# Patient Record
Sex: Female | Born: 2000 | Hispanic: Yes | Marital: Single | Smoking: Never smoker
Health system: Southern US, Community
[De-identification: ages and names within clinical notes are randomized; demographics above are authoritative.]

---

## 2020-04-17 ENCOUNTER — Emergency Department (HOSPITAL_COMMUNITY)
Admission: EM | Admit: 2020-04-17 | Discharge: 2020-04-18 | Disposition: A | Discharge status: Home / Self Care | Payer: Medicaid Other | Attending: Emergency Medicine | Admitting: Emergency Medicine

## 2020-04-17 ENCOUNTER — Encounter (HOSPITAL_COMMUNITY): Payer: Self-pay | Admitting: Emergency Medicine

## 2020-04-17 ENCOUNTER — Other Ambulatory Visit: Payer: Self-pay

## 2020-04-17 ENCOUNTER — Emergency Department (HOSPITAL_COMMUNITY): Payer: Medicaid Other

## 2020-04-17 DIAGNOSIS — N12 Tubulo-interstitial nephritis, not specified as acute or chronic: Secondary | ICD-10-CM | POA: Insufficient documentation

## 2020-04-17 DIAGNOSIS — Z20822 Contact with and (suspected) exposure to covid-19: Secondary | ICD-10-CM | POA: Diagnosis not present

## 2020-04-17 DIAGNOSIS — R000 Tachycardia, unspecified: Secondary | ICD-10-CM | POA: Insufficient documentation

## 2020-04-17 DIAGNOSIS — R109 Unspecified abdominal pain: Secondary | ICD-10-CM | POA: Diagnosis present

## 2020-04-17 LAB — CBC WITH DIFFERENTIAL/PLATELET
Abs Immature Granulocytes: 0.11 10*3/uL — ABNORMAL HIGH (ref 0.00–0.07)
Basophils Absolute: 0 10*3/uL (ref 0.0–0.1)
Basophils Relative: 0 %
Eosinophils Absolute: 0 10*3/uL (ref 0.0–0.5)
Eosinophils Relative: 0 %
HCT: 41.8 % (ref 36.0–46.0)
Hemoglobin: 13.8 g/dL (ref 12.0–15.0)
Immature Granulocytes: 1 %
Lymphocytes Relative: 4 %
Lymphs Abs: 0.7 10*3/uL (ref 0.7–4.0)
MCH: 29.4 pg (ref 26.0–34.0)
MCHC: 33 g/dL (ref 30.0–36.0)
MCV: 88.9 fL (ref 80.0–100.0)
Monocytes Absolute: 0.7 10*3/uL (ref 0.1–1.0)
Monocytes Relative: 4 %
Neutro Abs: 15.1 10*3/uL — ABNORMAL HIGH (ref 1.7–7.7)
Neutrophils Relative %: 91 %
Platelets: 210 10*3/uL (ref 150–400)
RBC: 4.7 MIL/uL (ref 3.87–5.11)
RDW: 14.1 % (ref 11.5–15.5)
WBC: 16.6 10*3/uL — ABNORMAL HIGH (ref 4.0–10.5)
nRBC: 0 % (ref 0.0–0.2)

## 2020-04-17 LAB — URINALYSIS, ROUTINE W REFLEX MICROSCOPIC
Bilirubin Urine: NEGATIVE
Glucose, UA: NEGATIVE mg/dL
Ketones, ur: 80 mg/dL — AB
Nitrite: NEGATIVE
Protein, ur: NEGATIVE mg/dL
Specific Gravity, Urine: 1.015 (ref 1.005–1.030)
WBC, UA: >50 WBC/hpf — ABNORMAL HIGH (ref 0–5)
pH: 6 (ref 5.0–8.0)

## 2020-04-17 LAB — COMPREHENSIVE METABOLIC PANEL
ALT: 19 U/L (ref 0–44)
AST: 24 U/L (ref 15–41)
Albumin: 4.3 g/dL (ref 3.5–5.0)
Alkaline Phosphatase: 68 U/L (ref 38–126)
Anion gap: 12 (ref 5–15)
BUN: 5 mg/dL — ABNORMAL LOW (ref 6–20)
CO2: 22 mmol/L (ref 22–32)
Calcium: 9.4 mg/dL (ref 8.9–10.3)
Chloride: 100 mmol/L (ref 98–111)
Creatinine, Ser: 0.87 mg/dL (ref 0.44–1.00)
GFR calc Af Amer: >60 mL/min (ref >60)
GFR calc non Af Amer: >60 mL/min (ref >60)
Glucose, Bld: 122 mg/dL — ABNORMAL HIGH (ref 70–99)
Potassium: 3.6 mmol/L (ref 3.5–5.1)
Sodium: 134 mmol/L — ABNORMAL LOW (ref 135–145)
Total Bilirubin: 1 mg/dL (ref 0.3–1.2)
Total Protein: 8 g/dL (ref 6.5–8.1)

## 2020-04-17 LAB — APTT: aPTT: 31 seconds (ref 24–36)

## 2020-04-17 LAB — I-STAT BETA HCG BLOOD, ED (MC, WL, AP ONLY): I-stat hCG, quantitative: <5 m[IU]/mL (ref <5)

## 2020-04-17 LAB — LACTIC ACID, PLASMA
Lactic Acid, Venous: 1.4 mmol/L (ref 0.5–1.9)
Lactic Acid, Venous: 1.7 mmol/L (ref 0.5–1.9)

## 2020-04-17 LAB — LIPASE, BLOOD: Lipase: 28 U/L (ref 11–51)

## 2020-04-17 LAB — PROTIME-INR
INR: 1.2 (ref 0.8–1.2)
Prothrombin Time: 14.5 seconds (ref 11.4–15.2)

## 2020-04-17 MED ORDER — SODIUM CHLORIDE 0.9 % IV SOLN
2.0000 g | Freq: Once | INTRAVENOUS | Status: AC
  Administered 2020-04-17: 2 g via INTRAVENOUS
  Filled 2020-04-17: qty 20

## 2020-04-17 MED ORDER — FENTANYL CITRATE (PF) 100 MCG/2ML IJ SOLN
50.0000 ug | Freq: Once | INTRAMUSCULAR | Status: AC
  Administered 2020-04-17: 50 ug via INTRAVENOUS
  Filled 2020-04-17: qty 2

## 2020-04-17 MED ORDER — IOHEXOL 300 MG/ML  SOLN
100.0000 mL | Freq: Once | INTRAMUSCULAR | Status: AC | PRN
  Administered 2020-04-17: 100 mL via INTRAVENOUS

## 2020-04-17 MED ORDER — SODIUM CHLORIDE 0.9 % IV BOLUS
1000.0000 mL | Freq: Once | INTRAVENOUS | Status: AC
  Administered 2020-04-17: 1000 mL via INTRAVENOUS

## 2020-04-17 MED ORDER — ACETAMINOPHEN 500 MG PO TABS
1000.0000 mg | ORAL_TABLET | Freq: Once | ORAL | Status: AC
  Administered 2020-04-17: 1000 mg via ORAL
  Filled 2020-04-17: qty 2

## 2020-04-17 MED ORDER — ACETAMINOPHEN 325 MG PO TABS
650.0000 mg | ORAL_TABLET | Freq: Once | ORAL | Status: AC
  Administered 2020-04-17: 650 mg via ORAL
  Filled 2020-04-17: qty 2

## 2020-04-17 NOTE — Consult Note (Addendum)
Family Medicine Teaching Service ED Consult Note Service Pager: [phone] Reason for Consult: Admission  Referring Physician: Dr. Rosalia Hammers   Primary Care Provider: System, Pcp Not In  Assessment and Plan: Judith Sosa is a 19 y.o. female presented to the ED for abdominal pain, nausea, emesis x1 times fever and diagnosed with pyelonephritis on CT scan.  On arrival, at 36 AM patient was febrile to 103.3 and tachycardic to 150. At 2000, patient provided with 650 mg of Tylenol and 1 L normal saline bolus.  At 2200, she was given IV fentanyl 2 g of ceftriaxone IV.  Patient is afebrile and vital signs otherwise stable.  On physical exam, patient is well-appearing.  Her vital signs are stable and much improved from previously. Labs significant for WBC 16.6, lactic acid wnl, UPT negative. UA with large leukocytes, many bacteria, and > 50 WBC. Urine and blood culture pending. CT with left sided pyelonephritis and no evidence of intraparenchymal abscess.  Patient reports that she feels well overall and has been tolerating p.o. today.  She would rather go home than be hospitalized.  She lives here in [place] with her boyfriend, who is at bedside.  She is amenable for follow-up in the family medicine clinic. We will follow patient's blood and urine culture and notify patient if any changes need to be made.    Pyelonephritis  Recommendations  Discharge patient home with p.o. antibiotics, Cefdinir  300 mg twice daily for 10 to 14 days and encourage continued oral hydration.  Patient reports that she uses Development worker, community on [employer]   Zofran PRN nausea   Ibuprofen PRN pain   Return precautions for inability to tolerate PO.   Patient scheduled for outpatient follow up with [facility]   Spoke to Dr. Drema Pry as Dr. Rosalia Hammers had gone home about patient discharge. He has discharged patient with DC instructions and Rx meds  Future Appointments  Date Time Provider Department Center  04/23/2020 11:25  AM ACCESS TO CARE POOL [facility]   Issues for follow up  - ensure compliance with medications - blood and urine culture   ============================================================================= HPI Judith Sosa is a 19 y.o. female no significant past medical history who presents with fever, abdominal pain, nausea, and one episode of emesis yesterday.  Patient reports that she started to have abdominal pain about 5 days ago.  She reports that that abdominal pain was located in her left belly and has acutely worsened since yesterday.  She does report having one episode of emesis accompanied by nausea not yesterday.  She does report dysuria that started a few days ago. Patient reports that she is tolerating PO fluids and was able to eat a sandwich earlier. No emesis since yesterday. Mild nausea. She denies any pelvic pain. Her LMP was about one month ago; she has regular periods.     Review Of Systems: Review of Systems  Constitutional: Positive for fever and malaise/fatigue.  HENT: Negative.   Eyes: Negative.   Respiratory: Negative.   Cardiovascular: Negative.   Gastrointestinal: Positive for nausea and vomiting. Negative for blood in stool, constipation and diarrhea.  Genitourinary: Positive for dysuria. Negative for frequency.  Musculoskeletal: Negative for myalgias.  Skin: Negative for rash.  Neurological: Negative for dizziness, weakness and headaches.  Endo/Heme/Allergies: Negative.   Psychiatric/Behavioral: Negative.    There are no problems to display for this patient.   History reviewed. No pertinent past medical history.  History reviewed. No pertinent surgical history.  family history is  not on file.   Social History   Judith Sosa reports that she has never smoked. She has never used smokeless tobacco. She reports that she does not drink alcohol and does not use drugs.  Allergies and Medications: No Known Allergies No outpatient medications have been marked as  taking for the 04/17/20 encounter [facility] Encounter).    Objective: BP 107/64   Pulse 97   Temp 99.6 F (37.6 C) (Oral)   Resp (!) 25   Ht 5\' 3"  (1.6 m)   Wt 51.3 kg   SpO2 100%   BMI 20.02 kg/m  Filed Weights   04/17/20 1115  Weight: 51.3 kg   Exam: Physical Exam Vitals and nursing note reviewed.  Constitutional:      General: She is not in acute distress.    Appearance: She is well-developed and normal weight. She is not ill-appearing, toxic-appearing or diaphoretic.  HENT:     Head: Normocephalic and atraumatic.     Mouth/Throat:     Mouth: Mucous membranes are moist.  Cardiovascular:     Rate and Rhythm: Normal rate and regular rhythm.     Pulses: Normal pulses.     Heart sounds: Normal heart sounds.  Pulmonary:     Effort: Pulmonary effort is normal.     Breath sounds: Normal breath sounds.  Abdominal:     General: Abdomen is flat. Bowel sounds are normal. There is no distension.     Palpations: Abdomen is soft.     Tenderness: There is abdominal tenderness in the left lower quadrant. There is no right CVA tenderness, left CVA tenderness, guarding or rebound.  Skin:    General: Skin is warm.     Capillary Refill: Capillary refill takes less than 2 seconds.  Neurological:     Mental Status: She is alert.  Psychiatric:        Mood and Affect: Mood normal.        Behavior: Behavior normal.      Labs and Imaging: I have personally reviewed following labs and imaging studies CBC: Recent Labs  Lab 04/17/20 1125  WBC 16.6*  NEUTROABS 15.1*  HGB 13.8  HCT 41.8  MCV 88.9  PLT 210   CMP: Recent Labs  Lab 04/17/20 1125  NA 134*  K 3.6  CL 100  CO2 22  GLUCOSE 122*  BUN 5*  CREATININE 0.87  CALCIUM 9.4  ALBUMIN 4.3     GFR: Estimated Creatinine Clearance: 83.5 mL/min (by C-G formula based on SCr of 0.87 mg/dL).  Liver Function Tests: Recent Labs  Lab 04/17/20 1125  AST 24  ALT 19  ALKPHOS 68  BILITOT 1.0  PROT 8.0  ALBUMIN 4.3    Recent Labs  Lab 04/17/20 1125  LIPASE 28    Coagulation Profile: Recent Labs  Lab 04/17/20 1125  INR 1.2    Urine analysis: Recent Labs    04/17/20 2029  COLORURINE YELLOW  APPEARANCEUR CLOUDY*  LABSPEC 1.015  PHURINE 6.0  GLUCOSEU NEGATIVE  HGBUR SMALL*  BILIRUBINUR NEGATIVE  KETONESUR 80*  PROTEINUR NEGATIVE  NITRITE NEGATIVE  LEUKOCYTESUR LARGE*    Sepsis Labs: No results for input(s): PROCALCITON in the last 72 hours.  Invalid input(s): LACTICIDVEN No results for input(s): PHVEN, PCO2VEN, HCO3 in the last 72 hours. No results for input(s): TROPIPOC in the last 72 hours.  No results found for this or any previous visit (from the past 240 hour(s)).   Imaging/Diagnostic Tests: DG Chest 2 View  Result Date: 04/17/2020  CLINICAL DATA:  Sepsis abdominal pain for 5 days with nausea and fever EXAM: CHEST - 2 VIEW COMPARISON:  None FINDINGS: Trachea midline. Cardiomediastinal contours and hilar structures are normal. Question mild central airway thickening. No lobar consolidation. No pleural effusion. Mild central airway thickening suggested. Subtle opacity at the RIGHT lung base. No acute skeletal process on limited assessment. IMPRESSION: Question mild central airway thickening and subtle opacity at the RIGHT lung base. Findings may represent early viral process. No consolidation or effusion. Electronically Signed   By: Donzetta Kohut M.D.   On: 04/17/2020 11:48   CT ABDOMEN PELVIS W CONTRAST  Result Date: 04/17/2020 CLINICAL DATA:  Right lower quadrant abdominal pain EXAM: CT ABDOMEN AND PELVIS WITH CONTRAST TECHNIQUE: Multidetector CT imaging of the abdomen and pelvis was performed using the standard protocol following bolus administration of intravenous contrast. CONTRAST:  OMNIPAQUE IOHEXOL 300 MG/ML  SOLN COMPARISON:  None. FINDINGS: Lower chest: No acute abnormality. Hepatobiliary: No focal liver abnormality is seen. No gallstones, gallbladder wall thickening,  or biliary dilatation. Pancreas: Unremarkable Spleen: Unremarkable Adrenals/Urinary Tract: The adrenal glands are unremarkable. The kidneys are normal in size and position. There is a striated nephrogram involving the lower pole of the left kidney with areas of parenchymal non enhancement in keeping with changes of pyelonephritis. No discrete intraparenchymal fluid collection identified to suggest an intraparenchymal abscess. There is no hydronephrosis. No intrarenal or ureteral calculi. The bladder is unremarkable. Stomach/Bowel: Stomach, small bowel, and large bowel are unremarkable. The appendix is not identified and may be absent. Vascular/Lymphatic: No significant vascular findings are present. No enlarged abdominal or pelvic lymph nodes. Reproductive: Uterus and bilateral adnexa are unremarkable. Other: The rectum is unremarkable. Musculoskeletal: No acute bone abnormality. IMPRESSION: Left-sided pyelonephritis. No evidence of intraparenchymal abscess. Correlation with urinalysis and urine culture may be helpful for further management. Electronically Signed   By: Helyn Numbers MD   On: 04/17/2020 22:32    EKG Interpretation  Date/Time:    Ventricular Rate:    PR Interval:    QRS Duration:   QT Interval:    QTC Calculation:   R Axis:     Text Interpretation:          Melene Plan, M.D. 04/18/2020, 12:29 AM PGY-3, [facility] FPTS Intern pager: [phone], text pages welcome

## 2020-04-17 NOTE — ED Notes (Signed)
Jesus, father, [phone] would like an update when available

## 2020-04-17 NOTE — ED Provider Notes (Signed)
[facility] EMERGENCY DEPARTMENT Provider Note   CSN: [id number] Arrival date & time: 04/17/20  1058     History Chief Complaint  Patient presents with  . Abdominal Pain  . Fever    Judith Sosa is a 19 y.o. female.  HPI   All 5 caveat history obtained through translator due to language barrier patient is Spanish-speaking 19 year old female G0 P0 sexually active presents today complaining of abdominal pain and fever.  Patient reports that she had left upper quadrant abdominal pain that began 5 days ago.  Yesterday she began having fever and had nausea with one episode of emesis.  She has been taking ibuprofen without relief.  Pain was worse today when she tried to urinate this morning.  She had not previously had any urinary tract infection symptoms denying previous pain with urination, frequency, or decreased urinary output.  She denies any diarrhea.  She has no known exposures to Covid and has previously been vaccinated for Covid.  Denies cough, chest pain, or dyspnea.  She denies any STIs or vaginal discharge. History reviewed. No pertinent past medical history.  There are no problems to display for this patient.   History reviewed. No pertinent surgical history.   OB History   No obstetric history on file.     No family history on file.  Social History   Tobacco Use  . Smoking status: Never Smoker  . Smokeless tobacco: Never Used  Substance Use Topics  . Alcohol use: Never  . Drug use: Never    Home Medications Prior to Admission medications   Not on File    Allergies    Patient has no known allergies.  Review of Systems   Review of Systems  All other systems reviewed and are negative.   Physical Exam Updated Vital Signs BP 140/86   Pulse (!) 111   Temp (!) 103 F (39.4 C) (Oral)   Resp 20   Ht 1.6 m (5\' 3" )   Wt 51.3 kg   SpO2 100%   BMI 20.02 kg/m   Physical Exam Vitals and nursing note reviewed.  Constitutional:       Appearance: She is well-developed.  HENT:     Head: Normocephalic and atraumatic.     Right Ear: External ear normal.     Left Ear: External ear normal.     Nose: Nose normal.  Eyes:     Conjunctiva/sclera: Conjunctivae normal.     Pupils: Pupils are equal, round, and reactive to light.  Cardiovascular:     Rate and Rhythm: Regular rhythm. Tachycardia present.     Heart sounds: Normal heart sounds.  Pulmonary:     Effort: Pulmonary effort is normal.     Breath sounds: Normal breath sounds.  Abdominal:     General: Bowel sounds are normal.     Palpations: Abdomen is soft.     Tenderness: There is abdominal tenderness in the right lower quadrant.  Musculoskeletal:        General: Normal range of motion.     Cervical back: Normal range of motion and neck supple.  Skin:    General: Skin is warm and dry.  Neurological:     Mental Status: She is alert and oriented to person, place, and time.     Deep Tendon Reflexes: Reflexes are normal and symmetric.  Psychiatric:        Behavior: Behavior normal.        Thought Content: Thought content normal.  Judgment: Judgment normal.     ED Results / Procedures / Treatments   Labs (all labs ordered are listed, but only abnormal results are displayed) Labs Reviewed  COMPREHENSIVE METABOLIC PANEL - Abnormal; Notable for the following components:      Result Value   Sodium 134 (*)    Glucose, Bld 122 (*)    BUN 5 (*)    All other components within normal limits  CBC WITH DIFFERENTIAL/PLATELET - Abnormal; Notable for the following components:   WBC 16.6 (*)    Neutro Abs 15.1 (*)    Abs Immature Granulocytes 0.11 (*)    All other components within normal limits  CULTURE, BLOOD (ROUTINE X 2)  CULTURE, BLOOD (ROUTINE X 2)  LACTIC ACID, PLASMA  PROTIME-INR  LIPASE, BLOOD  LACTIC ACID, PLASMA  URINALYSIS, ROUTINE W REFLEX MICROSCOPIC  I-STAT BETA HCG BLOOD, ED ([facility], [facility], [facility] ONLY)    EKG None  Radiology DG Chest 2  View  Result Date: 04/17/2020 CLINICAL DATA:  Sepsis abdominal pain for 5 days with nausea and fever EXAM: CHEST - 2 VIEW COMPARISON:  None FINDINGS: Trachea midline. Cardiomediastinal contours and hilar structures are normal. Question mild central airway thickening. No lobar consolidation. No pleural effusion. Mild central airway thickening suggested. Subtle opacity at the RIGHT lung base. No acute skeletal process on limited assessment. IMPRESSION: Question mild central airway thickening and subtle opacity at the RIGHT lung base. Findings may represent early viral process. No consolidation or effusion. Electronically Signed   By: Donzetta Kohut M.D.   On: 04/17/2020 11:48   CT ABDOMEN PELVIS W CONTRAST  Result Date: 04/17/2020 CLINICAL DATA:  Right lower quadrant abdominal pain EXAM: CT ABDOMEN AND PELVIS WITH CONTRAST TECHNIQUE: Multidetector CT imaging of the abdomen and pelvis was performed using the standard protocol following bolus administration of intravenous contrast. CONTRAST:  OMNIPAQUE IOHEXOL 300 MG/ML  SOLN COMPARISON:  None. FINDINGS: Lower chest: No acute abnormality. Hepatobiliary: No focal liver abnormality is seen. No gallstones, gallbladder wall thickening, or biliary dilatation. Pancreas: Unremarkable Spleen: Unremarkable Adrenals/Urinary Tract: The adrenal glands are unremarkable. The kidneys are normal in size and position. There is a striated nephrogram involving the lower pole of the left kidney with areas of parenchymal non enhancement in keeping with changes of pyelonephritis. No discrete intraparenchymal fluid collection identified to suggest an intraparenchymal abscess. There is no hydronephrosis. No intrarenal or ureteral calculi. The bladder is unremarkable. Stomach/Bowel: Stomach, small bowel, and large bowel are unremarkable. The appendix is not identified and may be absent. Vascular/Lymphatic: No significant vascular findings are present. No enlarged abdominal or pelvic  lymph nodes. Reproductive: Uterus and bilateral adnexa are unremarkable. Other: The rectum is unremarkable. Musculoskeletal: No acute bone abnormality. IMPRESSION: Left-sided pyelonephritis. No evidence of intraparenchymal abscess. Correlation with urinalysis and urine culture may be helpful for further management. Electronically Signed   By: Helyn Numbers MD   On: 04/17/2020 22:32    Procedures Procedures (including critical care time)  Medications Ordered in ED Medications  acetaminophen (TYLENOL) tablet 1,000 mg (1,000 mg Oral Given 04/17/20 1120)  acetaminophen (TYLENOL) tablet 650 mg (650 mg Oral Given 04/17/20 2014)    ED Course  I have reviewed the triage vital signs and the nursing notes.  Pertinent labs & imaging results that were available during my care of the patient were reviewed by me and considered in my medical decision making (see chart for details).    MDM Rules/Calculators/A&P  Patient presents today with abdominal pain, fever, leukocytosis, tachycardia. Work up c.w. pyelonephritis although significant rlq ttp IV fluids, rocephin given. Plan admission for ongoing iv fluids and antibiotics Discussed with Dr. Pecola Leisure, Family Practice who will see for admission  Final Clinical Impression(s) / ED Diagnoses Final diagnoses:  Pyelonephritis    Rx / DC Orders ED Discharge Orders    None       Margarita Grizzle, MD 04/17/20 2320

## 2020-04-17 NOTE — ED Notes (Signed)
Father-in-law, Jesus updated.

## 2020-04-17 NOTE — ED Triage Notes (Signed)
Left lower quadrant abd pain for the past 5 days with nausea and fever for the past 5 days, pt state she is Covid vaccinated and denies any covid exposure.

## 2020-04-18 LAB — SARS CORONAVIRUS 2 BY RT PCR (HOSPITAL ORDER, PERFORMED IN ~~LOC~~ HOSPITAL LAB): SARS Coronavirus 2: NEGATIVE

## 2020-04-18 MED ORDER — ONDANSETRON 4 MG PO TBDP: 4.0000 mg | ORAL_TABLET | Freq: Three times a day (TID) | ORAL | 0 refills | Status: AC | PRN

## 2020-04-18 MED ORDER — CEFDINIR 300 MG PO CAPS: 300.0000 mg | ORAL_CAPSULE | Freq: Two times a day (BID) | ORAL | 0 refills | Status: AC

## 2020-04-18 NOTE — Discharge Instructions (Signed)
You may take 600mg  motrin every 6-8 hours as need pain.

## 2020-04-19 LAB — URINE CULTURE: Culture: >=100000 — AB

## 2020-04-20 ENCOUNTER — Telehealth: Payer: Self-pay

## 2020-04-20 NOTE — Telephone Encounter (Signed)
Post ED Visit - Positive Culture Follow-up  Culture report reviewed by antimicrobial stewardship pharmacist: [facility] []  , Pharm.D. []  Enzo Bi, Pharm.D., BCPS AQ-ID []  , Pharm.D., BCPS []  Celedonio Miyamoto, .D., BCPS []  [place], .D., BCPS, AAHIVP []  Georgina Pillion, Pharm.D., BCPS, AAHIVP [x]  [address], PharmD, BCPS []  , PharmD, BCPS []  [place], PharmD, BCPS []  [address], PharmD []  , PharmD, BCPS []  Estella Husk, PharmD  Pharmacy Team []  Lysle Pearl, PharmD []  , PharmD []  Phillips Climes, PharmD []  , Rph []  Agapito Games) , PharmD []  Verlan Friends, PharmD []  , PharmD []  Mervyn Gay, PharmD []  , PharmD []  Vinnie Level, PharmD []  Wonda Olds, PharmD []  , PharmD []  Len Childs, PharmD   Positive Urine culture Treated with Cefdinir, organism sensitive to the same and no further patient follow-up is required at this time.  04/20/2020, 10:28 AM

## 2020-04-22 LAB — CULTURE, BLOOD (ROUTINE X 2)
Culture: NO GROWTH
Culture: NO GROWTH
Special Requests: ADEQUATE

## 2020-04-23 ENCOUNTER — Ambulatory Visit (INDEPENDENT_AMBULATORY_CARE_PROVIDER_SITE_OTHER): Payer: Self-pay | Admitting: Family Medicine

## 2020-04-23 ENCOUNTER — Other Ambulatory Visit: Payer: Self-pay

## 2020-04-23 VITALS — BP 98/56 | HR 67 | Ht 63.0 in | Wt 130.0 lb

## 2020-04-23 DIAGNOSIS — Z711 Person with feared health complaint in whom no diagnosis is made: Secondary | ICD-10-CM

## 2020-04-23 DIAGNOSIS — F489 Nonpsychotic mental disorder, unspecified: Secondary | ICD-10-CM

## 2020-04-23 DIAGNOSIS — N12 Tubulo-interstitial nephritis, not specified as acute or chronic: Secondary | ICD-10-CM

## 2020-04-23 LAB — POCT URINALYSIS DIP (CLINITEK)
Bilirubin, UA: NEGATIVE
Glucose, UA: NEGATIVE mg/dL
Ketones, POC UA: NEGATIVE mg/dL
Leukocytes, UA: NEGATIVE
Nitrite, UA: NEGATIVE
POC PROTEIN,UA: NEGATIVE
Spec Grav, UA: 1.02 (ref 1.010–1.025)
Urobilinogen, UA: 0.2 E.U./dL
pH, UA: 6.5 (ref 5.0–8.0)

## 2020-04-23 NOTE — Patient Instructions (Addendum)
Outpatient Mental Health Providers (No Insurance required or Self Pay)  [facility]  [address] [place], [place] [phone] Crisis [phone]  [facility]) can see uninsured folks for outpatient therapy [email or web address] [address] [place], [place] [address] [phone]  [facility]    Walk-in Mon-Fri, 8am-3pm [email or web address] [email or web address] [address], [place], [place]  [phone]   [address]  [place] [phone] [facility] for psych med management, there may be a wait- if [facility] is working with clients for OPT, they will coordinate with [facility] for psych  [facility]   Walk-in-Clinic: Monday- Friday 9:00 AM - 4:00 PM [address]   [place], [place] [phone]  [facility] the [place] ([employer]) walk in M-F 8am-12pm and  1pm-3pm [place]- [address]     [phone]  [employer] [address]  Phone: [phone]  [employer] (Mental Health and substance challenges) [address]   [place] [phone]    [email or web address]    [facility]  [place] -[address] [place], [place]     Phone:  [phone] [place]-  [address]  [phone]   [facility]  [address] [place]  [phone] [email or web address]   [facility] ( virtual or zoom therapy) [email or web address]  [address]  [phone]    [facility] [phone]  grief counseling, dementia and caregiver support    [facility] Walk-in MWF 12:30 to 3:00     [address] [place] [address]  [phone]  [email or web address] call to schedule an appointment    Mental [facility] Classes ,Support group, Peer support services, [address], [place], [place] [address] [phone]   PhotoSolver.pl           National Alliance on Mental Illness (NAMI) [place]- Wellness classes, Support groups        [address], [place], [place] [address] [phone]   [email or web address]   [facility]  (Psycho-social Rehabilitation clubhouse, Individual and group therapy) [address] [place], [place] [address]   [phone]  24- Hour Availability:  [facility] [phone] or [phone] * [facility] [employer] (Domestic Violence, Rape, etc. )[phone] Vesta Mixer [phone] or [phone] * [facility] [phone] only) [phone] (after hours) *[facility] Mobile Crisis Unit [phone] *[place] National Suicide Hotline 437-241-8759 Len Childs)   Continue your antibiotics. Tylenol for discomfort as needed.  If you have any concerns please call [phone]  Dana Allan, MD Family Medicine Residency

## 2020-04-24 ENCOUNTER — Telehealth: Payer: Self-pay | Admitting: *Deleted

## 2020-04-24 LAB — CBC WITH DIFFERENTIAL/PLATELET
Basophils Absolute: 0 10*3/uL (ref 0.0–0.2)
Basos: 1 %
EOS (ABSOLUTE): 0.1 10*3/uL (ref 0.0–0.4)
Eos: 2 %
Hematocrit: 37.2 % (ref 34.0–46.6)
Hemoglobin: 11.9 g/dL (ref 11.1–15.9)
Immature Grans (Abs): 0 10*3/uL (ref 0.0–0.1)
Immature Granulocytes: 1 %
Lymphocytes Absolute: 1.7 10*3/uL (ref 0.7–3.1)
Lymphs: 26 %
MCH: 28.6 pg (ref 26.6–33.0)
MCHC: 32 g/dL (ref 31.5–35.7)
MCV: 89 fL (ref 79–97)
Monocytes Absolute: 0.4 10*3/uL (ref 0.1–0.9)
Monocytes: 6 %
Neutrophils Absolute: 4.1 10*3/uL (ref 1.4–7.0)
Neutrophils: 64 %
Platelets: 345 10*3/uL (ref 150–450)
RBC: 4.16 x10E6/uL (ref 3.77–5.28)
RDW: 13.6 % (ref 11.7–15.4)
WBC: 6.3 10*3/uL (ref 3.4–10.8)

## 2020-04-24 NOTE — Chronic Care Management (AMB) (Signed)
   Care Management   Outreach Note  04/24/2020 Name: Judith Sosa MRN: [id number] DOB: Dec 03, 2000  Judith Sosa is a 19 y.o. year old female who is a primary care patient of Dana Allan, MD. I reached out to [name] by phone today in response to a referral sent by [name] PCP, Dana Allan, MD.     An unsuccessful telephone outreach was attempted today. The patient was referred to the case management team for assistance with care management and care coordination.   Follow Up Plan: A HIPAA compliant phone message was left for the patient providing contact information and requesting a return call. The care management team will reach out to the patient again over the next 7 days. If patient returns call to provider office, please advise to call Embedded Care Management Care Guide Gwenevere Ghazi at [phone].  Gwenevere Ghazi  Care Guide, Embedded Care Coordination [facility] Management

## 2020-04-24 NOTE — Progress Notes (Signed)
    SUBJECTIVE:   CHIEF COMPLAINT / HPI: follow up pylenonephritis  History obtained with use of Video interpreter service She is not an established patient at the clinic.  Patient was seen in the ED 09/07 for pyelonephritis.  She was consulted to FPTS who felt that hoppsital admission was not warranted at that time given improvement in the ED.  Blood cultures negative at that time.  Was given Ceftriaxone x1 and discharged on Cefdinir.  She continues to improve and reports small amount of discomfort on Left side.  Denies any fevers, dysuria or vaginal discharge.  PHQ 9 score 5 and scored some days thinking of harming self.  She has no SI/HI today and has no plan.    PERTINENT  PMH / PSH:  None  OBJECTIVE:   BP (!) 98/56   Pulse 67   Ht 5\' 3"  (1.6 m)   Wt 130 lb (59 kg)   LMP 04/21/2020   SpO2 99%   BMI 23.03 kg/m    General: Alert and oriented, no apparent distress  Cardiovascular: RRR with no murmurs noted Respiratory: CTA bilaterally  Gastrointestinal: Bowel sounds present. No abdominal pain.  Mild left CVA tnderness Psych: Behavior and speech appropriate to situation  ASSESSMENT/PLAN:   Pyelonephritis Improving.   -Conintnue Cefdinir as prescribed by FPTS -Financial packet and New patient package provided  -Strict return precautions provided -Follow up with PCP as needed or if symptoms worsen   Mental health problem PHQ9 score 5.  No SI/HI. -Cental health resources and 24 hr crisis lineprovided -CCM referral for mental health concerns -Financial and New patient packet provided -Follow up in 1 week      06/21/2020, MD [facility]

## 2020-04-28 ENCOUNTER — Encounter: Payer: Self-pay | Admitting: Family Medicine

## 2020-04-28 DIAGNOSIS — F489 Nonpsychotic mental disorder, unspecified: Secondary | ICD-10-CM | POA: Insufficient documentation

## 2020-04-28 DIAGNOSIS — N12 Tubulo-interstitial nephritis, not specified as acute or chronic: Secondary | ICD-10-CM | POA: Insufficient documentation

## 2020-04-28 NOTE — Assessment & Plan Note (Signed)
Improving.   -Conintnue Cefdinir as prescribed by FPTS -Financial packet and New patient package provided  -Strict return precautions provided -Follow up with PCP as needed or if symptoms worsen

## 2020-04-28 NOTE — Progress Notes (Deleted)
    SUBJECTIVE:   CHIEF COMPLAINT / HPI: follow up PHQ 9 positive  ***  PERTINENT  PMH / PSH: ***  OBJECTIVE:   LMP 04/21/2020   ***  ASSESSMENT/PLAN:   No problem-specific Assessment & Plan notes found for this encounter.     Dana Allan, MD [facility]

## 2020-04-28 NOTE — Assessment & Plan Note (Signed)
PHQ9 score 5.  No SI/HI. -Cental health resources and 24 hr crisis lineprovided -CCM referral for mental health concerns -Financial and New patient packet provided -Follow up in 1 week

## 2020-04-30 ENCOUNTER — Ambulatory Visit: Payer: Self-pay | Admitting: Family Medicine

## 2020-04-30 NOTE — Chronic Care Management (AMB) (Signed)
   Care Management   Outreach Note  04/30/2020 Name: Judith Sosa MRN: [id number] DOB: March 25, 2001  Judith Sosa is a 18 y.o. year old female who is a primary care patient of Dana Allan, MD. I reached out to [name] by phone today in response to a referral sent by [name] PCP, Dana Allan, MD.     A second unsuccessful telephone outreach was attempted today. The patient was referred to the case management team for assistance with care management and care coordination.   Follow Up Plan: The care management team will reach out to the patient again over the next 7 days. If patient returns call to provider office, please advise to call Embedded Care Management Care Guide Gwenevere Ghazi at [phone].  Gwenevere Ghazi  Care Guide, Embedded Care Coordination [facility] Management

## 2020-05-07 NOTE — Chronic Care Management (AMB) (Signed)
  Care Management   Outreach Note  05/07/2020 Name: Atalie Oros MRN: [id number] DOB: 04-30-2001  Kirin Brandenburger is a 19 y.o. year old female who is a primary care patient of Dana Allan, MD. I reached out to [name] by phone today in response to a referral sent by [name] PCP, Dana Allan, MD.     Third unsuccessful telephone outreach was attempted today. The patient was referred to the case management team for assistance with care management and care coordination. The patient's primary care provider has been notified of our unsuccessful attempts to make or maintain contact with the patient. The care management team is pleased to engage with this patient at any time in the future should he/she be interested in assistance from the care management team.   Follow Up Plan: We have been unable to make contact with the patient to schedule referral with LCSW. The care management team is available to follow up with the patient after provider conversation with the patient regarding recommendation for care management engagement and subsequent re-referral to the care management team.   [facility] Guide, Embedded Care Coordination [facility]  Care Management

## 2020-06-05 ENCOUNTER — Other Ambulatory Visit: Payer: Self-pay

## 2020-06-05 ENCOUNTER — Emergency Department (HOSPITAL_COMMUNITY)
Admission: EM | Admit: 2020-06-05 | Discharge: 2020-06-05 | Disposition: A | Discharge status: Home / Self Care | Payer: Medicaid Other | Attending: Emergency Medicine | Admitting: Emergency Medicine

## 2020-06-05 ENCOUNTER — Encounter (HOSPITAL_COMMUNITY): Payer: Self-pay | Admitting: Emergency Medicine

## 2020-06-05 ENCOUNTER — Emergency Department (HOSPITAL_COMMUNITY): Payer: Medicaid Other

## 2020-06-05 DIAGNOSIS — M791 Myalgia, unspecified site: Secondary | ICD-10-CM | POA: Diagnosis not present

## 2020-06-05 DIAGNOSIS — Z20822 Contact with and (suspected) exposure to covid-19: Secondary | ICD-10-CM | POA: Diagnosis not present

## 2020-06-05 DIAGNOSIS — R079 Chest pain, unspecified: Secondary | ICD-10-CM | POA: Diagnosis not present

## 2020-06-05 DIAGNOSIS — R059 Cough, unspecified: Secondary | ICD-10-CM | POA: Diagnosis present

## 2020-06-05 DIAGNOSIS — J069 Acute upper respiratory infection, unspecified: Secondary | ICD-10-CM | POA: Insufficient documentation

## 2020-06-05 LAB — BASIC METABOLIC PANEL
Anion gap: 11 (ref 5–15)
BUN: 6 mg/dL (ref 6–20)
CO2: 24 mmol/L (ref 22–32)
Calcium: 9.3 mg/dL (ref 8.9–10.3)
Chloride: 102 mmol/L (ref 98–111)
Creatinine, Ser: 0.63 mg/dL (ref 0.44–1.00)
GFR, Estimated: >60 mL/min (ref >60)
Glucose, Bld: 84 mg/dL (ref 70–99)
Potassium: 4.1 mmol/L (ref 3.5–5.1)
Sodium: 137 mmol/L (ref 135–145)

## 2020-06-05 LAB — CBC
HCT: 39.8 % (ref 36.0–46.0)
Hemoglobin: 12.8 g/dL (ref 12.0–15.0)
MCH: 29.2 pg (ref 26.0–34.0)
MCHC: 32.2 g/dL (ref 30.0–36.0)
MCV: 90.9 fL (ref 80.0–100.0)
Platelets: 245 10*3/uL (ref 150–400)
RBC: 4.38 MIL/uL (ref 3.87–5.11)
RDW: 14.6 % (ref 11.5–15.5)
WBC: 7.8 10*3/uL (ref 4.0–10.5)
nRBC: 0 % (ref 0.0–0.2)

## 2020-06-05 LAB — RESPIRATORY PANEL BY RT PCR (FLU A&B, COVID)
Influenza A by PCR: NEGATIVE
Influenza B by PCR: NEGATIVE
SARS Coronavirus 2 by RT PCR: NEGATIVE

## 2020-06-05 LAB — ACETAMINOPHEN LEVEL: Acetaminophen (Tylenol), Serum: <10 ug/mL — ABNORMAL LOW (ref 10–30)

## 2020-06-05 LAB — TROPONIN I (HIGH SENSITIVITY): Troponin I (High Sensitivity): 4 ng/L (ref <18)

## 2020-06-05 LAB — I-STAT BETA HCG BLOOD, ED (MC, WL, AP ONLY): I-stat hCG, quantitative: <5 m[IU]/mL (ref <5)

## 2020-06-05 LAB — SALICYLATE LEVEL: Salicylate Lvl: <7 mg/dL — ABNORMAL LOW (ref 7.0–30.0)

## 2020-06-05 MED ORDER — BENZONATATE 100 MG PO CAPS: 100.0000 mg | ORAL_CAPSULE | Freq: Three times a day (TID) | ORAL | 0 refills | Status: AC

## 2020-06-05 NOTE — Discharge Instructions (Addendum)
At this time there does not appear to be the presence of an emergent medical condition, however there is always the potential for conditions to change. Please read and follow the below instructions.  Please return to the Emergency Department immediately for any new or worsening symptoms or if your symptoms do not improve within 3 days. Please be sure to follow up with your Primary Care Provider within one week regarding your visit today; please call their office to schedule an appointment even if you are feeling better for a follow-up visit.  If you do not have a primary care provider please call the [facility] on your discharge paperwork to establish one. You may use the medication Tessalon as prescribed to help with your cough.  Please drink plenty water to avoid dehydration and get plenty of rest.  Go to the nearest Emergency Department immediately if: You have fever or chills You have shortness of breath that gets worse. You have very bad or constant: Headache. Ear pain. Pain in your forehead, behind your eyes, and over your cheekbones (sinus pain). Chest pain. You have long-lasting (chronic) lung disease along with any of these: Wheezing. Long-lasting cough. Coughing up blood. A change in your usual mucus. You have a stiff neck. You have changes in your: Vision. Hearing. Thinking. Mood. You have any new/concerning or worsening of symptoms   Please read the additional information packets attached to your discharge summary.  Do not take your medicine if  develop an itchy rash, swelling in your mouth or lips, or difficulty breathing; call 911 and seek immediate emergency medical attention if this occurs.  You may review your lab tests and imaging results in their entirety on your MyChart account.  Please discuss all results of fully with your primary care provider and other specialist at your follow-up visit.  Note: Portions of this text may  have been transcribed using voice recognition software. Every effort was made to ensure accuracy; however, inadvertent computerized transcription errors may still be present. ---------------------- Below has been translated using Google translate.  Errors may be present.  Interpret with caution.  A continuacin se ha traducido con Soil scientist. Puede haber errores. Interprete con precaucin. ----------------------- En este momento no parece haber la presencia de una afeccin mdica emergente, sin embargo, siempre existe la posibilidad de que las afecciones [place]. Lea y siga las instrucciones a continuacin.  1. Regrese al Departamento de Emergencias de inmediato por cualquier sntoma nuevo o que empeore o si sus sntomas no mejoran en 3 das. 2. Asegrese de hacer un seguimiento con su Proveedor de atencin primaria dentro de una semana con respecto a su visita de hoy; llame a su oficina para programar una cita, incluso si se siente mejor para una visita de seguimiento. Si no tiene un proveedor de Marine scientist, llame al [employer] y Health visitor de la comunidad de [facility] en su documentacin de alta para establecer uno. 3. Puede usar el [employer] segn lo prescrito para ayudar con la tos. Beba mucha agua para evitar la deshidratacin y descanse lo suficiente.  Vaya al Departamento de Emergencias ms cercano de inmediato si: ? Tienes fiebre o escalofros 1. Tiene dificultad para respirar que empeora. 2. Tienes muy mal o constante: o Dolor de [place]. o Dolor de odo. o Dolor en la frente, detrs de los ojos y [employer] pmulos (dolor de los senos nasales). o Dolor de [place]. 3. Tiene una enfermedad pulmonar prolongada (  crnica) junto con cualquiera de los siguientes: o Sibilancias. o Tos prolongada. o Tos con sangre. o Un cambio en su moco habitual. 4. Tiene rigidez en el cuello. 5. Tiene cambios en su: o Visin. o Audicin. o Pensar. o Estado de  nimo. 6. Tiene sntomas nuevos, preocupantes o que empeoran   Lea los paquetes de informacin adicional adjuntos a su resumen de alta.  No tome su medicamento si desarrolla un sarpullido con picazn, hinchazn en su boca o labios, o dificultad para respirar; Llame al 911 y busque atencin mdica de emergencia inmediata si esto ocurre.  Puede revisar sus pruebas de laboratorio y los resultados de las imgenes en su totalidad en su cuenta MyChart. Por favor, analice todos los resultados con su proveedor de atencin primaria y Dietitian en su visita de seguimiento.  Nota: Es posible que algunas partes de este texto se hayan transcrito con un software de reconocimiento de voz. Se hizo todo lo posible para garantizar la precisin; sin embargo, an Charity fundraiser errores de transcripcin computarizados inadvertidos.

## 2020-06-05 NOTE — ED Notes (Signed)
Pt blood drawn in triage 

## 2020-06-05 NOTE — ED Notes (Signed)
Patient verbalizes understanding of discharge instructions. Opportunity for questioning and answers were provided. Pt discharged from ED ambulatory.  

## 2020-06-05 NOTE — ED Provider Notes (Signed)
[facility] EMERGENCY DEPARTMENT Provider Note   CSN: [id number] Arrival date & time: 06/05/20  1779     History Chief Complaint  Patient presents with  . Cough  . Chest Pain   Spanish video translator used during this visit. Judith Sosa is a 19 y.o. female otherwise healthy no daily medication use.  Patient presents today for cough, chest pain, rhinorrhea, body aches onset 5 days ago.  Symptoms have been gradually worsening since onset.  She describes a nonproductive cough associated with a central chest burning pain which will occasionally radiate to her back with a strong cough, this is a moderate intensity pain improves when she is not coughing.  She describes clear rhinorrhea as well.  Generalized body aches without focal pain.  Additionally patient reports few episodes of nonbloody diarrhea over the past few days.  Patient reports that she has taken a whole packet of aspirin, patient believes this may be around 8 pills unknown dosage for her symptoms.  The patient reports that she has received 2 doses of the move during Covid vaccine earlier this year.  She denies any sick contacts.  Reports feeling warm at home has not measured a fever.  Denies headache, stiff neck, hemoptysis, abdominal pain, vomiting, extremity swelling/color change, numbness/tingling, weakness, history of blood clot, history of exogenous hormone use, history of cancer, recent surgery/immobilization or any additional concerns. HPI     History reviewed. No pertinent past medical history.  Patient Active Problem List   Diagnosis Date Noted  . Pyelonephritis 04/28/2020  . Mental health problem 04/28/2020    History reviewed. No pertinent surgical history.   OB History   No obstetric history on file.     No family history on file.  Social History   Tobacco Use  . Smoking status: Never Smoker  . Smokeless tobacco: Never Used  Substance Use Topics  . Alcohol use: Never  . Drug  use: Never    Home Medications Prior to Admission medications   Medication Sig Start Date End Date Taking? Authorizing Provider  benzonatate (TESSALON) 100 MG capsule Take 1 capsule (100 mg total) by mouth every 8 (eight) hours. 06/05/20   Bill Salinas, PA-C    Allergies    Patient has no known allergies.  Review of Systems   Review of Systems Ten systems are reviewed and are negative for acute change except as noted in the HPI  Physical Exam Updated Vital Signs BP 110/65   Pulse 77   Temp 98.7 F (37.1 C) (Oral)   Resp 16   LMP 05/18/2020   SpO2 100%   Physical Exam Constitutional:      General: She is not in acute distress.    Appearance: Normal appearance. She is well-developed. She is not ill-appearing or diaphoretic.  HENT:     Head: Normocephalic and atraumatic.     Jaw: There is normal jaw occlusion.     Mouth/Throat:     Comments: The patient has normal phonation and is in control of secretions. No stridor.  Midline uvula without edema. Soft palate rises symmetrically. No tonsillar erythema, swelling or exudates. Tongue protrusion is normal, floor of mouth is soft. No trismus. No creptius on neck palpation. No gingival erythema or fluctuance noted. Mucus membranes moist. Eyes:     General: Vision grossly intact. Gaze aligned appropriately.     Pupils: Pupils are equal, round, and reactive to light.  Neck:     Trachea: Trachea and phonation normal.  Cardiovascular:     Rate and Rhythm: Normal rate and regular rhythm.     Pulses:          Dorsalis pedis pulses are 2+ on the right side and 2+ on the left side.  Pulmonary:     Effort: Pulmonary effort is normal. No respiratory distress.     Breath sounds: Normal breath sounds.  Abdominal:     General: There is no distension.     Palpations: Abdomen is soft.     Tenderness: There is no abdominal tenderness. There is no guarding or rebound.  Musculoskeletal:        General: Normal range of motion.      Cervical back: Normal range of motion.     Right lower leg: No tenderness. No edema.     Left lower leg: No tenderness. No edema.  Skin:    General: Skin is warm and dry.  Neurological:     Mental Status: She is alert.     GCS: GCS eye subscore is 4. GCS verbal subscore is 5. GCS motor subscore is 6.     Comments: Speech is clear and goal oriented, follows commands Major Cranial nerves without deficit, no facial droop Moves extremities without ataxia, coordination intact  Psychiatric:        Behavior: Behavior normal.     ED Results / Procedures / Treatments   Labs (all labs ordered are listed, but only abnormal results are displayed) Labs Reviewed  SALICYLATE LEVEL - Abnormal; Notable for the following components:      Result Value   Salicylate Lvl <7.0 (*)    All other components within normal limits  ACETAMINOPHEN LEVEL - Abnormal; Notable for the following components:   Acetaminophen (Tylenol), Serum <10 (*)    All other components within normal limits  RESPIRATORY PANEL BY RT PCR (FLU A&B, COVID)  BASIC METABOLIC PANEL  CBC  I-STAT BETA HCG BLOOD, ED ([facility], [facility], [facility] ONLY)  TROPONIN I (HIGH SENSITIVITY)    EKG EKG Interpretation  Date/Time:  Wednesday June 05 2020 09:53:13 EDT Ventricular Rate:  89 PR Interval:  140 QRS Duration: 80 QT Interval:  356 QTC Calculation: 433 R Axis:   49 Text Interpretation: Normal sinus rhythm Normal ECG Confirmed by Vanetta Mulders [phone]) on 06/05/2020 11:34:54 AM   Radiology DG Chest Port 1 View  Result Date: 06/05/2020 CLINICAL DATA:  Chest pain and cough EXAM: PORTABLE CHEST 1 VIEW COMPARISON:  2021 FINDINGS: Suspected subtle opacity seen previously is not identified. No new consolidation or edema. No pleural effusion or pneumothorax. Stable cardiomediastinal contours with normal heart size. IMPRESSION: No acute process in the chest. Electronically Signed   By: Guadlupe Spanish M.D.   On: 06/05/2020 10:48     Procedures Procedures (including critical care time)  Medications Ordered in ED Medications - No data to display  ED Course  I have reviewed the triage vital signs and the nursing notes.  Pertinent labs & imaging results that were available during my care of the patient were reviewed by me and considered in my medical decision making (see chart for details).    MDM Rules/Calculators/A&P                         Additional history obtained from: 1. Nursing notes from this visit. 2. Review of electronic medical record.  Patient seen in the ED on 04/17/2020 diagnosis of pyelonephritis treated with cefdinir. -------------------------------- 19 year old female presented for  URI symptoms since Friday with nonproductive burning cough, rhinorrhea, generalized body aches and nonbloody diarrhea.  On exam patient is well-appearing no acute distress vital signs within limits on room air.  No fever, tachycardia, tachypnea, hypoxia or hypotension.  Cranial nerves intact, no meningeal signs, airway clear without evidence of PTA, RPA, Ludwick's, sinusitis or other deep space infections of the head or neck.  Cardiopulmonary examination is unremarkable.  Abdomen soft nontender without peritoneal signs.  Neurovascular tact to all 4 extremities without evidence of DVT.  Patient is low risk by Wells criteria and PERC negative. - I ordered, reviewed and interpreted labs which include: Covid/influenza panel negative. Pregnancy test negative. High-sensitivity troponin within normal limits, no indication for delta troponin. BMP without electrolyte derangement, AKI or gap. CBC without looks ptosis to suggest bacterial infection, no anemia. Tylenol and salicylate levels are negative.  These were obtained as patient did not know what dosage the aspirin that she was taking earlier this week was.  Chest x-ray:    IMPRESSION:  No acute process in the chest.   EKG: Normal sinus rhythm Normal ECG Confirmed by  Vanetta Mulders [phone]) on 06/05/2020 11:34:54 AM ---- Work-up today is reassuring.  Patient was ambulated around the emergency department without hypoxia or shortness of breath.  There is no indication for admission or further work-up at this time.  As patient is low risk by Wells criteria and PERC negative there is low suspicion for pulmonary embolism as cause of symptoms today.  Suspect viral URI and cough with possible costochondritis.  No evidence for ACS, PE, dissection, anemia, bacterial pneumonia or other emergent pathologies.  Will prescribe Tessalon to help with cough encourage water hydration and rest as well as PCP follow-up.  At this time there does not appear to be any evidence of an acute emergency medical condition and the patient appears stable for discharge with appropriate outpatient follow up. Diagnosis was discussed with patient who verbalizes understanding of care plan and is agreeable to discharge. I have discussed return precautions with patient who verbalizes understanding. Patient encouraged to follow-up with their PCP. All questions answered.  Patient's case discussed with Dr. Deretha Emory who agrees with plan to discharge with follow-up.   Judith Sosa was evaluated in Emergency Department on 06/05/2020 for the symptoms described in the history of present illness. She was evaluated in the context of the global COVID-19 pandemic, which necessitated consideration that the patient might be at risk for infection with the SARS-CoV-2 virus that causes COVID-19. Institutional protocols and algorithms that pertain to the evaluation of patients at risk for COVID-19 are in a state of rapid change based on information released by regulatory bodies including the CDC and federal and state organizations. These policies and algorithms were followed during the patient's care in the ED.   Spanish video interpreter used throughout this visit.  Note: Portions of this report may have been  transcribed using voice recognition software. Every effort was made to ensure accuracy; however, inadvertent computerized transcription errors may still be present. Final Clinical Impression(s) / ED Diagnoses Final diagnoses:  Viral URI with cough    Rx / DC Orders ED Discharge Orders         Ordered    benzonatate (TESSALON) 100 MG capsule  Every 8 hours        06/05/20 1539           Elizabeth Palau 06/05/20 1543    Vanetta Mulders, MD 06/06/20 [phone]

## 2020-06-05 NOTE — ED Triage Notes (Signed)
Pt reports pain to center of chest that radiates to back with cough since Friday. No known COVID exposures.

## 2020-06-05 NOTE — ED Notes (Signed)
Pt was ambulated around the room for 2 minutes and maintained oxygen saturation above 95% on room air.

## 2020-12-30 IMAGING — DX DG CHEST 1V PORT
1 series · 1 of 1 positions shown · non-contrast
Comparison: 1314

CLINICAL DATA: Chest pain and cough

EXAM:
PORTABLE CHEST 1 VIEW

[chest]
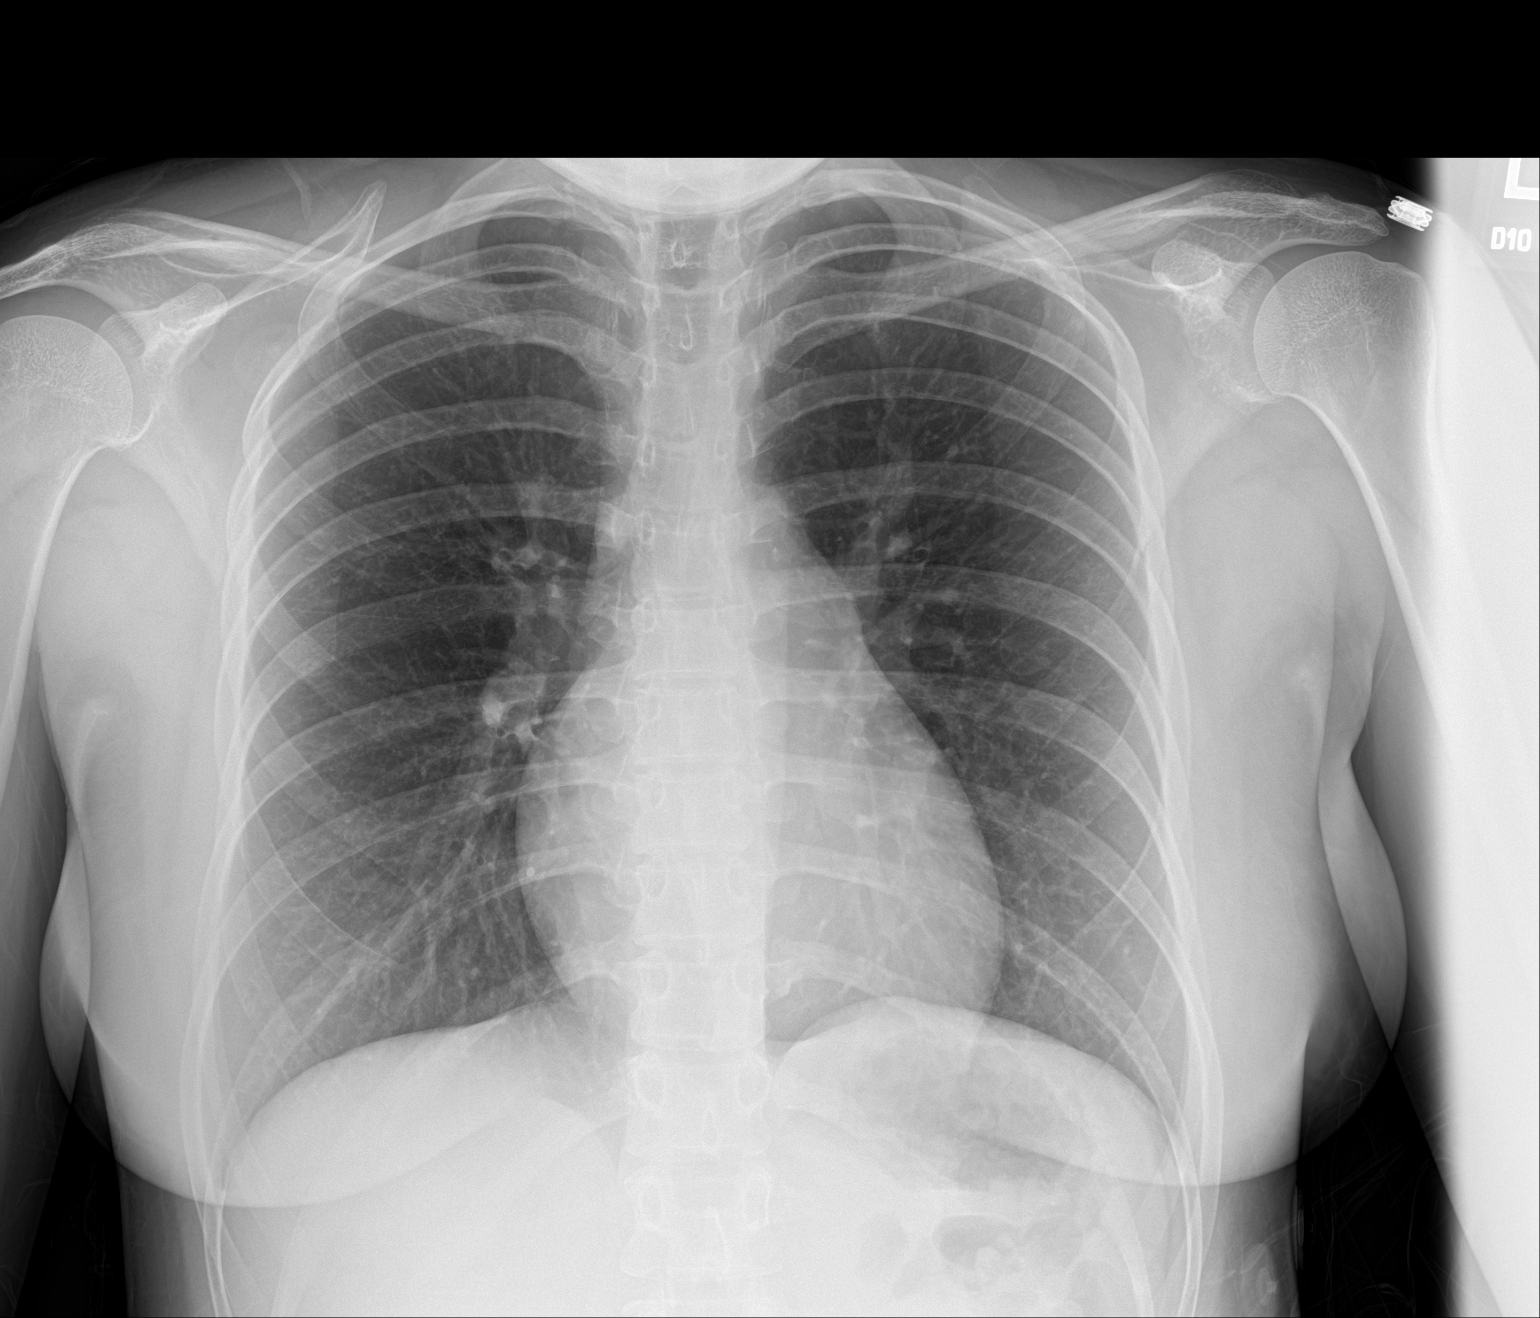

[1 of 1 positions shown; findings below may reference images not displayed]

FINDINGS: Suspected subtle opacity seen previously is not identified. No new
consolidation or edema. No pleural effusion or pneumothorax. Stable
cardiomediastinal contours with normal heart size.
IMPRESSION: No acute process in the chest.
